# Patient Record
Sex: Male | Born: 2000
Health system: Southern US, Community
[De-identification: ages and names within clinical notes are randomized; demographics above are authoritative.]

## PROBLEM LIST (undated history)

## (undated) DIAGNOSIS — F909 Attention-deficit hyperactivity disorder, unspecified type: Secondary | ICD-10-CM

## (undated) HISTORY — PX: APPENDECTOMY: SHX54

## (undated) HISTORY — DX: Attention-deficit hyperactivity disorder, unspecified type: F90.9

---
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
## 2022-01-13 ENCOUNTER — Telehealth (INDEPENDENT_AMBULATORY_CARE_PROVIDER_SITE_OTHER): Payer: BC Managed Care – PPO | Admitting: Family

## 2022-01-13 ENCOUNTER — Encounter: Payer: Self-pay | Admitting: Family

## 2022-01-13 ENCOUNTER — Other Ambulatory Visit: Payer: Self-pay

## 2022-01-13 DIAGNOSIS — F902 Attention-deficit hyperactivity disorder, combined type: Secondary | ICD-10-CM | POA: Diagnosis not present

## 2022-01-13 DIAGNOSIS — Z719 Counseling, unspecified: Secondary | ICD-10-CM | POA: Diagnosis not present

## 2022-01-13 DIAGNOSIS — Z79899 Other long term (current) drug therapy: Secondary | ICD-10-CM

## 2022-01-13 DIAGNOSIS — Z7189 Other specified counseling: Secondary | ICD-10-CM

## 2022-01-13 DIAGNOSIS — Z87898 Personal history of other specified conditions: Secondary | ICD-10-CM | POA: Diagnosis not present

## 2022-01-13 DIAGNOSIS — Z8659 Personal history of other mental and behavioral disorders: Secondary | ICD-10-CM

## 2022-01-13 NOTE — Progress Notes (Signed)
?[facility] ?[facility] ?[address] ?[place] [address] ?Dept: [phone] ?Dept Fax: [phone] ? ?Medication Check visit via Virtual Video  ? ?Patient ID:  David Holmes  male DOB: 09/28/2001   21 y.o.   MRN: [id number]  ? ?DATE:01/13/22 ? ?PCP: David Salmon, MD ? ?Virtual Visit via Video Note ? ?I connected with  David Holmes on 01/13/22 at  1:00 PM EDT by a video enabled telemedicine application and verified that I am speaking with the correct person using two identifiers. Patient/Parent Location: at school ?  ?I discussed the limitations, risks, security and privacy concerns of performing an evaluation and management service by telephone and the availability of in person appointments. I also discussed with the parents that there may be a patient responsible charge related to this service. The parents expressed understanding and agreed to proceed. ? ?Provider: Carron Curie, NP  Location: work location ? ?HPI/CURRENT STATUS: ?David Holmes is here for medication management of the psychoactive medications for ADHD and review of educational and behavioral concerns.  ? ?David Holmes currently taking Focalin XR 20 mg daily, which is working well. Takes medication at 8:00 am. Medication tends to wear off around 3-4:00 pm. David Holmes is able to focus through school & homework.  ? ?David Holmes is eating well (eating breakfast, lunch and dinner). David Holmes does not have appetite suppression ? ?Sleeping well (goes to bed at 10-11:00 pm wakes at 7-8:00 am), sleeping through the night. David Holmes does not have delayed sleep onset ? 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 Or any other days will working with father  ?Activities/ Exercise: intermittently-Lifting weights, dog playing with dog ? ?MEDICAL  HISTORY: ?Individual Medical History/ Review of Systems: None  Has been healthy with no visits to the PCP. WCC due yearly.  ? ?Family Medical/ Social History: Changes? None reported recently ?Patient Lives with: parents and dog.  ? ?MENTAL HEALTH: ?Mental Health Issues: None ? ?Allergies: ?No Known Allergies ? ?Current Medications:  ?Current Outpatient Medications  ?Medication Instructions  ? DENTA 5000 PLUS 1.1 % CREA dental cream 1 application., Daily  ? dexmethylphenidate (FOCALIN XR) 20 mg, Oral, Daily  ? ibuprofen (ADVIL) 200 mg, Every 6 hours PRN  ? loratadine (CLARITIN) 5 mg, Daily  ? ?Medication Side Effects: None ? ?DIAGNOSES:  ?  ICD-10-CM   ?1. ADHD (attention deficit hyperactivity disorder), combined type  F90.2   ?  ?2. History of oppositional defiant disorder  Z86.59   ?  ?3. History of learning disability  Z87.898   ?  ?4. Patient counseled  Z71.9   ?  ?5. Medication management  Z79.899   ?  ?6. Goals of care, counseling/discussion  Z71.89   ?  ? ?ASSESSMENT:      ?David Holmes is a 21 year old male with a history of ADHD and Learning difficulties. Has been well maintained on Focalin XR 20 mg daily with good efficacy and no side effects. Academically doing well at school with all A's right now with taking 12 credit hours. Getting no formal services for his learning needs  Working 2 part time jobs and saving his money. Eating well and working out on a regular basis. Sleeping with no current concerns. To continue with Focalin XR 20 mg daily with no changes today.  ? ?PLAN/RECOMMENDATIONS:  ?Updates for school, classes, current  grades and academic progress. ? ?No formal services in place for his learning needs at [facility] but tutoring is available.  ? ?Working now 2 part-time jobs and working more hours to make more money along with saving for the future. ? ?Eating well with no issues and working out on a regular basis along with participating in various activities. ? ?Sleeping well with no current concerns and  getting plenty of sleep each night. ? ?Consistency with medication reported and good efficacy for the time needed during the day at school.  ? ?Parent-child conflict between David Holmes and his mother discussed due to ongoing issues.  ?May need counseling to assist with conflict resolution. Patient agreed but mother not willing to participate.  ? ?Counseled medication pharmacokinetics, options, dosage, administration, desired effects, and possible side effects.   ?Focalin XR 20 mg daily, no Rx today ? ?I discussed the assessment and treatment plan with the patient. The patient was provided an opportunity to ask questions and all were answered. The patient agreed with the plan and demonstrated an understanding of the instructions. ?  ?NEXT APPOINTMENT:  ?Visit date not found-f/u visit in 3 months Telehealth OK ? ?The patient was advised to call back or seek an in-person evaluation if the symptoms worsen or if the condition fails to improve as anticipated. ? ?David Curie, NP ? ?

## 2022-02-01 ENCOUNTER — Telehealth: Payer: Self-pay | Admitting: Family

## 2022-02-01 MED ORDER — DEXMETHYLPHENIDATE HCL ER 20 MG PO CP24: 20.0000 mg | ORAL_CAPSULE | Freq: Every day | ORAL | 0 refills | Status: DC

## 2022-02-01 NOTE — Telephone Encounter (Signed)
Mom called in refill for focalin to be sent to [facility] pharmacy. ?

## 2022-02-01 NOTE — Telephone Encounter (Signed)
Focalin XR 20 mg daily, # 30 with no RF's.RX for above e-scribed and sent to pharmacy on record  [facility] - [place], [place] - [address] [place] [address] Phone: [phone] Fax: [phone]   

## 2022-02-21 ENCOUNTER — Other Ambulatory Visit: Payer: Self-pay

## 2022-02-22 MED ORDER — DEXMETHYLPHENIDATE HCL ER 10 MG PO CP24: 10.0000 mg | ORAL_CAPSULE | Freq: Every day | ORAL | 0 refills | Status: DC

## 2022-02-22 MED ORDER — DEXMETHYLPHENIDATE HCL ER 20 MG PO CP24: 20.0000 mg | ORAL_CAPSULE | Freq: Every day | ORAL | 0 refills | Status: DC

## 2022-02-22 NOTE — Telephone Encounter (Signed)
Focalin XR 20 mg daily, # 30 with no RF's and Focalin XR 10 mg on an occasion, # 30 with no RF's.RX for above e-scribed and sent to pharmacy on record ? ?[facility] - [place], [place] - [address] ?[address] ?[place] [address] ?Phone: [phone] Fax: [phone] ? ? ?

## 2022-03-30 ENCOUNTER — Other Ambulatory Visit: Payer: Self-pay

## 2022-03-30 MED ORDER — DEXMETHYLPHENIDATE HCL ER 20 MG PO CP24: 20.0000 mg | ORAL_CAPSULE | Freq: Every day | ORAL | 0 refills | Status: DC

## 2022-03-30 MED ORDER — DEXMETHYLPHENIDATE HCL ER 10 MG PO CP24: 10.0000 mg | ORAL_CAPSULE | Freq: Every day | ORAL | 0 refills | Status: DC

## 2022-03-30 NOTE — Telephone Encounter (Signed)
Focalin XR 20 mg daily on school days # 30 with no RF's and Focalin XR 10 mg for evening or weekends, # 30 with no RF's.RX for above e-scribed and sent to pharmacy on record  [facility] [place], [place] - [address] [place] [address] Phone: [phone] Fax: [phone]

## 2022-04-28 ENCOUNTER — Other Ambulatory Visit: Payer: Self-pay

## 2022-04-29 ENCOUNTER — Telehealth: Payer: Self-pay | Admitting: Pediatrics

## 2022-04-29 MED ORDER — DEXMETHYLPHENIDATE HCL ER 10 MG PO CP24: 10.0000 mg | ORAL_CAPSULE | Freq: Every day | ORAL | 0 refills | Status: DC

## 2022-04-29 MED ORDER — DEXMETHYLPHENIDATE HCL ER 20 MG PO CP24: 20.0000 mg | ORAL_CAPSULE | Freq: Every day | ORAL | 0 refills | Status: DC

## 2022-04-29 NOTE — Telephone Encounter (Signed)
Focalin XR 20 mg daily, # 30 with no RF's and Focalin XR 10 mg daily, # 30 with no RF's.RX for above e-scribed and sent to pharmacy on record  [facility] [place], [place] - [address] [place] [address] Phone: [phone] Fax: [phone]

## 2022-04-29 NOTE — Telephone Encounter (Signed)
PA submitted via CoverMyMeds.

## 2022-05-26 ENCOUNTER — Encounter: Payer: Self-pay | Admitting: Family

## 2022-05-26 ENCOUNTER — Ambulatory Visit (INDEPENDENT_AMBULATORY_CARE_PROVIDER_SITE_OTHER): Payer: BC Managed Care – PPO | Admitting: Family

## 2022-05-26 VITALS — BP 112/64 | HR 68 | Resp 16 | Ht 65.25 in | Wt 130.0 lb

## 2022-05-26 DIAGNOSIS — Z8659 Personal history of other mental and behavioral disorders: Secondary | ICD-10-CM | POA: Diagnosis not present

## 2022-05-26 DIAGNOSIS — Z79899 Other long term (current) drug therapy: Secondary | ICD-10-CM

## 2022-05-26 DIAGNOSIS — F902 Attention-deficit hyperactivity disorder, combined type: Secondary | ICD-10-CM

## 2022-05-26 DIAGNOSIS — Z87898 Personal history of other specified conditions: Secondary | ICD-10-CM

## 2022-05-26 DIAGNOSIS — Z7189 Other specified counseling: Secondary | ICD-10-CM

## 2022-05-26 DIAGNOSIS — Z719 Counseling, unspecified: Secondary | ICD-10-CM

## 2022-05-26 MED ORDER — DEXMETHYLPHENIDATE HCL ER 10 MG PO CP24: 10.0000 mg | ORAL_CAPSULE | Freq: Every day | ORAL | 0 refills | Status: DC

## 2022-05-26 MED ORDER — DEXMETHYLPHENIDATE HCL ER 20 MG PO CP24: 20.0000 mg | ORAL_CAPSULE | Freq: Every day | ORAL | 0 refills | Status: DC

## 2022-05-26 NOTE — Progress Notes (Signed)
[facility] [address] [place] [address] Dept: [phone] Dept Fax: [phone] Loc: [phone] Loc Fax: [phone]  Medication Check  Patient ID: David Holmes, male  DOB: Dec 19, 2000, 21 y.o.  MRN: [id number]  Date of Evaluation: 05/26/2022 PCP: Chales Salmon, MD  Accompanied by:  self Patient Lives with: parents and dog  HISTORY/CURRENT STATUS: HPI Patient here by himself today for the visit. Patient interactive and appropriate with provider. No significant changes in the past few months since last f/u visit on 01/13/2022. Has continued with medication of Focalin XR 20 mg and 10 mg dally with no side effects or adverse effects.   EDUCATION: School: [employer] 3 classes for this summer Year/Grade: college Performance/ Grades: average Services: Other: help as needed  Activities/ Exercise:  working with father and increased activity Working with father most days during the week  MEDICAL HISTORY: Appetite: Good  MVI/Other: None   Sleep: Bedtime: 2400 the latest  Awakens: 0700  Concerns: Initiation/Maintenance/Other: None   Individual Medical History/ Review of Systems: Changes? :None  Allergies: Patient has no known allergies.  Current Medications:  Current Outpatient Medications  Medication Instructions   DENTA 5000 PLUS 1.1 % CREA dental cream 1 application , Daily   dexmethylphenidate (FOCALIN XR) 20 mg, Oral, Daily   dexmethylphenidate (FOCALIN XR) 10 mg, Oral, Daily   ibuprofen (ADVIL) 200 mg, Every 6 hours PRN   loratadine (CLARITIN) 5 mg, Daily   Medication Side Effects: None Family Medical/ Social History: Changes? None   MENTAL HEALTH: Mental Health Issues:  None   PHYSICAL EXAM; Vitals:   General Physical Exam: Unchanged from previous exam, date:01/13/2022 Changed:None   DIAGNOSES:    ICD-10-CM   1. Attention  deficit hyperactivity disorder (ADHD), combined type  F90.2     2. History of learning disability  Z87.898     3. History of oppositional defiant disorder  Z86.59     4. Medication management  Z79.899     5. Patient counseled  Z71.9     6. Goals of care, counseling/discussion  Z71.89      ASSESSMENT: Radames is a 21 year old male with a history of ADHD, Dysgraphia and ODD behaviors. Maintained on Focalin XR 20 mg during the week and Focalin XR 10 mg daily for the weekends or holidays. Good efficacy reported and no side effects reported. Academically did well for the summer classes and enrolled in fall classes with 12 credit hours. No formal services in place for academics. 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 Staying active with physical activity with work duties daily. Eating plenty of calories and drinking plenty of water. Sleeping well with no current issues reported. No changes with medication or dosing.   RECOMMENDATIONS:  Updates with school and work provided today with support given.  No formal services in place or academic or learning needs.  Relationship problems with mother discussed with patient.  Working with father full time and activity during the day.  Healthy eating and calorie intake needs to support level of activity.  Environment at home with emotional struggles with mother and between parents.   Sleep issues with no concerns at this point in time.  Medication management discussed with current medication and dose.   Counseled medication pharmacokinetics, options, dosage, administration, desired effects, and possible side effects.   Focalin XR 20 mg daily,# 30 with no RF's Focalin XR 10 mg daily, #  30 with no RF's RX for above e-scribed and sent to pharmacy on record  [facility] [place], [place] - [address] [place] [address] Phone: [phone] Fax: [phone]  I discussed the assessment and treatment  plan with the patient & parent. The patient & parent was provided an opportunity to ask questions and all were answered. The patient & parent agreed with the plan and demonstrated an understanding of the instructions.  NEXT APPOINTMENT: Return in about 3 months (around 08/26/2022) for f/u visit .  The patient & parent was advised to call back or seek an in-person evaluation if the symptoms worsen or if the condition fails to improve as anticipated.  Carron Curie, NP

## 2022-05-26 NOTE — Patient Instructions (Signed)
[facility]  [phone] email- [email or web address]

## 2022-06-13 DIAGNOSIS — L255 Unspecified contact dermatitis due to plants, except food: Secondary | ICD-10-CM | POA: Diagnosis not present

## 2022-07-05 ENCOUNTER — Other Ambulatory Visit: Payer: Self-pay

## 2022-07-05 MED ORDER — DEXMETHYLPHENIDATE HCL ER 20 MG PO CP24: 20.0000 mg | ORAL_CAPSULE | Freq: Every day | ORAL | 0 refills | Status: DC

## 2022-07-05 MED ORDER — DEXMETHYLPHENIDATE HCL ER 10 MG PO CP24: 10.0000 mg | ORAL_CAPSULE | Freq: Every day | ORAL | 0 refills | Status: DC

## 2022-07-05 NOTE — Telephone Encounter (Signed)
Focalin XR 20 mg and 10 mg each daily, # 30 with no RF's.RX for above e-scribed and sent to pharmacy on record  [facility] [place], [place] - [address] [place] [address] Phone: [phone] Fax: [phone]

## 2022-08-01 ENCOUNTER — Other Ambulatory Visit: Payer: Self-pay

## 2022-08-01 MED ORDER — DEXMETHYLPHENIDATE HCL ER 20 MG PO CP24: 20.0000 mg | ORAL_CAPSULE | Freq: Every day | ORAL | 0 refills | Status: DC

## 2022-08-01 MED ORDER — DEXMETHYLPHENIDATE HCL ER 10 MG PO CP24: 10.0000 mg | ORAL_CAPSULE | Freq: Every day | ORAL | 0 refills | Status: DC

## 2022-08-01 NOTE — Telephone Encounter (Signed)
Focalin XR 20 mg daily, # 30 with no RF's and Focalin XR 10 mg in the afternoon, # 30 with no RF's. RX for above e-scribed and sent to pharmacy on record  [facility] [place], [place] - [address] [place] [address] Phone: [phone] Fax: [phone]

## 2022-08-23 ENCOUNTER — Encounter: Payer: Self-pay | Admitting: Family

## 2022-08-23 ENCOUNTER — Ambulatory Visit (INDEPENDENT_AMBULATORY_CARE_PROVIDER_SITE_OTHER): Payer: BC Managed Care – PPO | Admitting: Family

## 2022-08-23 VITALS — BP 118/72 | HR 78 | Resp 16 | Ht 66.0 in | Wt 129.2 lb

## 2022-08-23 DIAGNOSIS — Z79899 Other long term (current) drug therapy: Secondary | ICD-10-CM

## 2022-08-23 DIAGNOSIS — Z87898 Personal history of other specified conditions: Secondary | ICD-10-CM

## 2022-08-23 DIAGNOSIS — Z7189 Other specified counseling: Secondary | ICD-10-CM

## 2022-08-23 DIAGNOSIS — Z8659 Personal history of other mental and behavioral disorders: Secondary | ICD-10-CM | POA: Diagnosis not present

## 2022-08-23 DIAGNOSIS — F902 Attention-deficit hyperactivity disorder, combined type: Secondary | ICD-10-CM

## 2022-08-23 DIAGNOSIS — Z8669 Personal history of other diseases of the nervous system and sense organs: Secondary | ICD-10-CM | POA: Diagnosis not present

## 2022-08-23 DIAGNOSIS — Z719 Counseling, unspecified: Secondary | ICD-10-CM

## 2022-08-23 NOTE — Progress Notes (Signed)
[facility] [address] [place] [address] Dept: [phone] Dept Fax: [phone] Loc: [phone] Loc Fax: [phone]  Medication Check  Patient ID: David Holmes, male  DOB: 11/21/2000, 21 y.o.  MRN: [id number]  Date of Evaluation: 08/23/2022 PCP: Harrie Jeans, MD  Accompanied by:  self Patient Lives with: father, mother left  HISTORY/CURRENT STATUS: HPI Patient here by himself for the visit today. Patient is interactive and appropriate with provider today. Patient with no significant changes reported since last f/u visit 05/26/2022. Has continued with Focalin XR 20 mg daily and occasional use of Focalin XR 10 mg on the weekends with no side effects.   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 for 30 hours/week  MEDICAL HISTORY: Appetite: Good  MVI/Other: None  Sleep: Bedtime: 2300  Awakens: 0700  Concerns: Initiation/Maintenance/Other: None  Individual Medical History/ Review of Systems: Changes? :None   Allergies: Patient has no known allergies.  Current Medications: Current Outpatient Medications  Medication Instructions   DENTA 5000 PLUS 1.1 % CREA dental cream 1 application , Daily   dexmethylphenidate (FOCALIN XR) 10 mg, Oral, Daily   dexmethylphenidate (FOCALIN XR) 20 mg, Oral, Daily   ibuprofen (ADVIL) 200 mg, Every 6 hours PRN   loratadine (CLARITIN) 5 mg, Daily   Medication Side Effects: None  Family Medical/ Social History: Changes? Yes mother moved out into an apartment.  MENTAL HEALTH: Mental Health Issues:  None  PHYSICAL EXAM; Vitals: Vitals:   08/23/22 1408  BP: 118/72  Pulse: 78  Resp: 16  Weight: 129 lb 3.2  oz (58.6 kg)  Height: 5\' 6"  (1.676 m)    General Physical Exam: Unchanged from previous exam, date:05/26/2022 Changed:None  DIAGNOSES:    ICD-10-CM   1. ADHD (attention deficit hyperactivity disorder), combined type  F90.2     2. History of learning disability  Z87.898     3. History of migraine  Z86.69     4. History of oppositional defiant disorder  Z86.59     5. Medication management  Z79.899     6. Patient counseled  Z71.9     7. Goals of care, counseling/discussion  Z71.89      ASSESSMENT:  [name] is a [name] is a 21 year old male with a history of ADHD and Dysgraphia. He has been maintained on Focalin XR 20 mg and 10 mg each depending on the day. Good efficacy and no side effects reported. 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 Recently started at [employer] and working about 25 hours weekly along with about 30 hours with his father. No changes with eating, sleeping or health. Mother moved out into her own apartment and patient has had little contact with her since. Has continued with Focalin XR both doses with no changes today.   RECOMMENDATIONS:  Updates with school and current training with future work with his father.  Discussed transition to different course of action with his education.   Working at [employer] about 25 hours weekly along with working with his father.  Suggested healthy eating habits and staying active with work related duties.  Mother moved out recently and in her own apartment and patient feels that there is less stressors at home.   Sleep schedule with current work schedule with both jobs.   Medication management discussed with current dose and medications for weekdays and weekends.   Counseled medication pharmacokinetics, options, dosage, administration, desired effects, and possible side effects.   Focalin  XR 20 mg daily, no Rx today Focalin XR 10 mg daily, no Rx today  I discussed the assessment and treatment plan with the patient & parent. The patient & parent was provided an opportunity to ask questions and all were answered. The patient & parent agreed with the plan and demonstrated an understanding of the instructions.  NEXT APPOINTMENT: Return in about 3 months (around 11/23/2022) for f/u visit.   The patient & parent was advised to call back or seek an in-person evaluation if the symptoms worsen or if the condition fails to improve as anticipated.  Carron Curie, NP   Counseling Time: 35 mins Total Contact Time: 40 mins

## 2022-08-24 ENCOUNTER — Other Ambulatory Visit: Payer: Self-pay

## 2022-08-24 MED ORDER — DEXMETHYLPHENIDATE HCL ER 20 MG PO CP24: 20.0000 mg | ORAL_CAPSULE | Freq: Every day | ORAL | 0 refills | Status: DC

## 2022-08-24 MED ORDER — DEXMETHYLPHENIDATE HCL ER 10 MG PO CP24: 10.0000 mg | ORAL_CAPSULE | Freq: Every day | ORAL | 0 refills | Status: DC

## 2022-08-24 NOTE — Telephone Encounter (Signed)
RX for above e-scribed and sent to pharmacy on record  [facility] [place], [place] - [address] [place] [address] Phone: [phone] Fax: [phone]   

## 2022-09-09 ENCOUNTER — Other Ambulatory Visit: Payer: Self-pay

## 2022-09-13 MED ORDER — DEXMETHYLPHENIDATE HCL ER 10 MG PO CP24: 10.0000 mg | ORAL_CAPSULE | Freq: Every day | ORAL | 0 refills | Status: DC

## 2022-09-13 MED ORDER — DEXMETHYLPHENIDATE HCL ER 20 MG PO CP24: 20.0000 mg | ORAL_CAPSULE | Freq: Every day | ORAL | 0 refills | Status: DC

## 2022-09-13 NOTE — Progress Notes (Signed)
Focalin XR 20 mg daily, #30 with no RF's and Focalin XR 10 mg daily, #30 with no RF's. Post dated for 09/24/2022.RX for above e-scribed and sent to pharmacy on record  [facility] [place], [place] - [address] [address] Phone: [phone] Fax: [phone]

## 2022-10-10 ENCOUNTER — Telehealth: Payer: Self-pay | Admitting: Family

## 2022-10-10 MED ORDER — DEXMETHYLPHENIDATE HCL ER 10 MG PO CP24: 10.0000 mg | ORAL_CAPSULE | Freq: Every day | ORAL | 0 refills | Status: DC

## 2022-10-10 MED ORDER — DEXMETHYLPHENIDATE HCL ER 20 MG PO CP24: 20.0000 mg | ORAL_CAPSULE | Freq: Every day | ORAL | 0 refills | Status: DC

## 2022-10-10 NOTE — Telephone Encounter (Signed)
Focalin XR 20 mg in the day time and Focalin XR 10 mg for evening dose, #30 each Rx with no RF's.RX for above e-scribed and sent to pharmacy on record  [facility] [place], [place] - [address] [place] [address] Phone: [phone] Fax: [phone]

## 2022-10-10 NOTE — Telephone Encounter (Signed)
[facility]

## 2022-11-09 ENCOUNTER — Encounter: Payer: BC Managed Care – PPO | Admitting: Family

## 2022-11-10 ENCOUNTER — Encounter: Payer: Self-pay | Admitting: Family

## 2022-11-10 ENCOUNTER — Ambulatory Visit (INDEPENDENT_AMBULATORY_CARE_PROVIDER_SITE_OTHER): Payer: BC Managed Care – PPO | Admitting: Family

## 2022-11-10 VITALS — BP 130/82 | HR 76 | Resp 16 | Ht 66.5 in | Wt 137.2 lb

## 2022-11-10 DIAGNOSIS — Z8669 Personal history of other diseases of the nervous system and sense organs: Secondary | ICD-10-CM

## 2022-11-10 DIAGNOSIS — Z79899 Other long term (current) drug therapy: Secondary | ICD-10-CM

## 2022-11-10 DIAGNOSIS — Z7189 Other specified counseling: Secondary | ICD-10-CM

## 2022-11-10 DIAGNOSIS — Z8659 Personal history of other mental and behavioral disorders: Secondary | ICD-10-CM

## 2022-11-10 DIAGNOSIS — F902 Attention-deficit hyperactivity disorder, combined type: Secondary | ICD-10-CM | POA: Diagnosis not present

## 2022-11-10 DIAGNOSIS — Z719 Counseling, unspecified: Secondary | ICD-10-CM

## 2022-11-10 DIAGNOSIS — Z87898 Personal history of other specified conditions: Secondary | ICD-10-CM

## 2022-11-10 MED ORDER — DEXMETHYLPHENIDATE HCL ER 20 MG PO CP24: 20.0000 mg | ORAL_CAPSULE | Freq: Every day | ORAL | 0 refills | Status: DC

## 2022-11-10 MED ORDER — DEXMETHYLPHENIDATE HCL ER 10 MG PO CP24: 10.0000 mg | ORAL_CAPSULE | Freq: Every day | ORAL | 0 refills | Status: DC

## 2022-11-10 NOTE — Progress Notes (Signed)
[facility] [address], [address] [place] [address] Dept: [phone] Dept Fax: [phone] Loc: [phone] Loc Fax: [phone]  Medication Check  Patient ID: David Holmes, male  DOB: 09-Apr-2001, 22 y.o.  MRN: [id number]  Date of Evaluation: 11/11/2022 PCP: Harrie Jeans, MD  Accompanied by:  self Patient Lives with: father  HISTORY/CURRENT STATUS: HPI Patient here by himself for the visit today. Patient interactive and appropriate with provider. Patient has had no significant changes since last f/u visit on 08/23/2022. Has continued with his medication with no changes or side effects.   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 :None reported recently  Allergies: Patient has no known allergies.  Current Medications:  Current Outpatient Medications  Medication Instructions   DENTA 5000 PLUS 1.1 % CREA dental cream 1 application , Daily   dexmethylphenidate (FOCALIN XR) 10 mg, Oral, Daily   dexmethylphenidate (FOCALIN XR) 20 mg, Oral, Daily   ibuprofen (ADVIL) 200 mg, Every 6 hours PRN   loratadine (CLARITIN) 5 mg, Daily  Medication Side Effects: None  Family Medical/ Social History: Changes? None reported  MENTAL HEALTH: Mental Health Issues:  None   PHYSICAL EXAM; Vitals:   General Physical Exam: Unchanged from previous exam, date: 08/23/2022 Changed:None  DIAGNOSES:    ICD-10-CM   1. ADHD (attention deficit hyperactivity disorder), combined type  F90.2     2. History of  migraine  Z86.69     3. History of oppositional defiant disorder  Z86.59     4. Medication management  Z79.899     5. History of learning disability  Z87.898     6. Patient counseled  Z71.9     7. Goals of care, counseling/discussion  Z71.89     ASSESSMENT: David Holmes is a 22 year old male with a history of ADHD and L/D. Has been on Focalin XR 20 mg daily and using Focalin XR 10 mg daily in the afternoon or weekends. No formal services in place for learning. Not enrolled in [employer] for this semester and will re-enroll in the fall of 2024. Working at [employer] for 5 hour shifts and work for 2 shifts some days. 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 Staying active and work is physically taxing. Eating plenty each day with no concerns. David Holmes has a good sleep schedule daily for work. Has continued with Focalin XR for both doses.   RECOMMENDATIONS:  Discussed updates with school, work and family history since the last f/u visit on 08/23/2022.  Work days and continuation at [employer] for 5 hours shift with some days 2 shifts/day.  Health updates and f/u visits with healthcare providers recommended yearly.  Supported healthy eating habits and continued exercise when possible.  Sleep habits and sleep schedule discussed with patient for adequate sleep.  Family dynamics reviewed with mother leaving the house.  Medication management discussed with patient today with good efficacy.  Counseled medication pharmacokinetics, options, dosage, administration, desired effects, and possible side effects.   Focalin XR 20 mg daily, #30 with no RF's Focalin XR 10 mg daily, #30 with no RF's RX  for above e-scribed and sent to pharmacy on record  [facility], [place] - [address] [place] [address] Phone: [phone] Fax: [phone]  I discussed the assessment and treatment plan with the patient & parent. The patient & parent was provided an  opportunity to ask questions and all were answered. The patient & parent agreed with the plan and demonstrated an understanding of the instructions.   NEXT APPOINTMENT: Return in about 3 months (around 02/09/2023) for f/u visit .  The patient & parent was advised to call back or seek an in-person evaluation if the symptoms worsen or if the condition fails to improve as anticipated.   Carolann Littler, NP   Counseling Time: 38 mins Total Contact Time: 41 mins

## 2022-11-11 ENCOUNTER — Encounter: Payer: Self-pay | Admitting: Family

## 2022-11-30 ENCOUNTER — Other Ambulatory Visit: Payer: Self-pay

## 2022-11-30 MED ORDER — DEXMETHYLPHENIDATE HCL ER 20 MG PO CP24: 20.0000 mg | ORAL_CAPSULE | Freq: Every day | ORAL | 0 refills | Status: AC

## 2022-11-30 MED ORDER — DEXMETHYLPHENIDATE HCL ER 10 MG PO CP24: 10.0000 mg | ORAL_CAPSULE | Freq: Every day | ORAL | 0 refills | Status: AC

## 2022-11-30 NOTE — Telephone Encounter (Signed)
RX for above e-scribed and sent to pharmacy on record  [id number] - [place], [place] - [address] [place] [address] Phone: [phone] Fax: [phone]
